# Patient Record
Sex: Female | Born: 1995
Health system: Southern US, Community
[De-identification: ages and names within clinical notes are randomized; demographics above are authoritative.]

## PROBLEM LIST (undated history)

## (undated) DIAGNOSIS — N83209 Unspecified ovarian cyst, unspecified side: Secondary | ICD-10-CM

## (undated) DIAGNOSIS — J45909 Unspecified asthma, uncomplicated: Secondary | ICD-10-CM

## (undated) HISTORY — DX: Unspecified ovarian cyst, unspecified side: N83.209

## (undated) HISTORY — PX: NO PAST SURGERIES: SHX2092

---
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
## 2014-07-21 NOTE — Discharge Summary (Signed)
PATIENT NAME:  Tracy Howard, Tracy Howard MR#:  [id number] DATE OF BIRTH:  05/17/1995  DATE OF ADMISSION:  09/17/2011 DATE OF DISCHARGE:  09/20/2011  HOSPITAL COURSE: See the dictated history and physical for details of admission. This 19 year old woman was brought into the Emergency Room after being found passed out in public. It transpired that she had not been sleeping for days due to anxiety. She had taken some Xanax from a friend and become dehydrated and tried to walk across town. She was admitted to the hospital for observation and support and orientation to psychiatric treatment. In the hospital, the patient was pleasant and cooperative. After being somewhat down and sad on first admission her affect improved significantly. She denied any suicidal ideation. She did not engage in any dangerous behavior. She attended groups appropriately. She gave a history consistent with chronic anxiety and depression. I started her on citalopram 20 mg a day, which he has tolerated fine without any significant problems. No new medical problems have arisen. At the time of discharge, the patient is showing improved insight and tolerating medication well. Supportive therapy was done as well as education about taking care of herself and getting treatment for chronic depression. The patient was agreeable to a follow-up treatment plan.   DISCHARGE MEDICATION: Citalopram 20 mg p.o. daily.   LABORATORY RESULTS: Drug screen was positive for benzodiazepines. Urinalysis positive for white blood cells and blood and she was currently having her period. Thyroid stimulating hormone normal. Chemistry panel unremarkable. CBC showed slightly low hemoglobin at 11.7.   MENTAL STATUS EXAMINATION: Neatly dressed and groomed young woman who looks her stated age. Alert and oriented x4. Short-term and long-term memory intact. Interaction normal. Speech normal rate, tone, and volume. Psychomotor activity normal. Affect euthymic, upbeat, and appropriately  reactive. Mood stated as good. Thoughts are lucid with no evidence of thought disorder or loosening of associations. Denies auditory or visual hallucinations. Denies suicidal or homicidal ideation. Shows improved judgment and insight.   DISPOSITION: Discharged back to continue living with her boyfriend. Arrangements will be made for follow-up treatment at [facility].     DIAGNOSIS PRINCIPLE AND PRIMARY:   AXIS I: Depression, not otherwise specified.   SECONDARY DIAGNOSES:   AXIS I: Deferred.   AXIS II: Borderline traits.   AXIS III: No diagnosis.   AXIS IV: Moderate from difficult relationship with her boyfriend.   AXIS V: Functioning at time of discharge 58. ____________________________ Audery AmelJohn T. Klever Twyford, MD jtc:slb D: 09/20/2011 16:06:39 ET T: 09/21/2011 11:40:33 ET JOB#: [id number]  cc: Audery AmelJohn T. Maribel Hadley, MD, <Dictator> Audery AmelJOHN T Aydia Maj MD ELECTRONICALLY SIGNED 09/22/2011 9:31

## 2014-07-21 NOTE — H&P (Signed)
PATIENT NAME:  Tracy Howard, Tracy Howard MR#:  [id number] DATE OF BIRTH:  01/18/1996  DATE OF ADMISSION:  09/17/2011  IDENTIFYING INFORMATION AND CHIEF COMPLAINT: 19 year old woman brought in by EMS after being found passed out in the street.   CHIEF COMPLAINT: "I got in a fight with my boyfriend".   HISTORY OF PRESENT ILLNESS: The patient was found passed out in public and EMS were called. The patient states that she had been arguing and fighting with her boyfriend for the last several days. He left her at the house and took the car so that she could not leave. She felt like she could not stay there anymore and decided to walk to her mother's house. Evidently the mother's house is about a three-hour walk or so from where she started. Additionally, the patient said that she had not had anything to drink all day and had not had anything to eat for the last couple of days. Additionally, she admits that she took at least six Xanax tablets which she took from a friend of hers. She began to get dizzy and evidently passed out on her walk. The patient states that her mood recently has been up and down, mostly frustrated with her boyfriend with whom she has been fighting. She recently discovered that he had cheated on her with a friend of hers and they have been fighting nonstop ever since. She says she has not slept in several days and does not ever feel like eating. She has still been able to go to work, however. She denies any suicidal ideation and denies that she is trying to kill herself. She denies any psychotic symptoms. She indicates that her usual sleep patterns are pretty chaotic. She denies that she drinks alcohol. Denies that she uses any other drugs. Currently not in any kind of psychiatric treatment, not taking any psychiatric medicine or receiving any counseling.   PAST PSYCHIATRIC HISTORY: The patient has received counseling when she was an adolescent after her parents split up but never gotten any psychiatric  treatment since then. Never been in a psychiatric hospital. Denies that she has ever tried to kill herself in the past. Denies ever being on any psychiatric medicine. Denies any history of violence to others.   SUBSTANCE ABUSE HISTORY: She denies that she drinks alcohol currently or regularly. Says that she does not abuse any drugs. She indicates that it was an unusual thing that she took this overdose of Xanax.   FAMILY HISTORY: No known family history of mental illness.   PAST MEDICAL HISTORY: Has mild asthma but it doesn't seem to cause her any trouble and she does not take anything for it. No other known medical problems.   SOCIAL HISTORY: The patient lives with her boyfriend. They have been together for about a year and a half. Recently he cheated on her with her closest friend with the predictable anger and discord in the house except that the patient has continued to stay there and fight with him rather than leave. 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 Not in school.   REVIEW OF SYSTEMS: States that she has recently been tired. Been feeling moody and somewhat depressed. Denies any suicidal ideation. Denies psychotic symptoms. Admits that she occasionally cuts herself as a way to release tension but denies that it was ever a wish to harm herself.   MENTAL STATUS EXAMINATION: Reasonably well groomed, healthy appearing young woman interviewed in the Emergency Room. Alert  and oriented x4. Fully awake. Cooperative with the interview. Eye contact normal. Psychomotor activity normal. Speech normal rate, tone, and volume. Affect is anxious but reactive. Mood stated as being okay. Thoughts are lucid with no obvious loosening of associations or delusions. Denies any psychotic symptoms. No evidence of paranoia. Denies suicidal or homicidal ideation. Judgment and insight recently somewhat impaired. Cognitively generally appears to have good short-term and long-term memory.    PHYSICAL EXAMINATION:   GENERAL: The patient is a healthy looking young woman in no acute distress. She has acne on her face, particularly on her forehead but no other skin lesions that are acute. She has marks from old cutting on her left forearm.   HEENT: Face is symmetric. Oral mucosa normal. Pupils equal and reactive.   NECK: Supple and nontender.   BACK: Nontender.   MUSCULOSKELETAL: Full range of motion at all extremities. Normal gait. Strength and reflexes normal and symmetric throughout.   NEUROLOGICAL: Cranial nerves symmetric and normal.   LUNGS: Clear to auscultation with no wheezes.   HEART: Regular rate and rhythm. Normal sounds.   ABDOMEN: Soft, nontender. Normal bowel sounds.   VITAL SIGNS: Temperature 97.8, pulse 67, respirations 20, blood pressure 113/59.   LABORATORY, DIAGNOSTIC, AND RADIOLOGICAL DATA: Most notable for positive benzodiazepines on the drug screen. Urinalysis shows white and red blood cells, no bacteria. She says that she is currently on her menstrual period which probably explains it. Thyroid stimulating hormone normal. Chemistry panel and CBC unremarkable.   ASSESSMENT: This is an 19 year old woman who passed out walking from her boyfriend's to her mother's house. She is denying suicidal ideation. Does not have a full syndrome of depression but does sound like she has some chronic mood instability. I am concerned, however, that that she did take an excessive number of Xanax pills impulsively without knowing what they would do indicating recent poor judgment. I think that it would be the right thing to do to at least briefly admit her for stabilization and consider referral for outpatient treatment.   TREATMENT PLAN:  1. Admit to the psychiatry ward.  2. I've suggested to her that we try starting citalopram for her anxiety, mood, and irritability and she agrees to this. 20 mg a day of citalopram will be started. Otherwise, no new medications.   3. Engage patient in groups and activities on the unit. 4. Involve her with planning for discharge and follow-up after she leaves.   DIAGNOSES PRINCIPLE AND PRIMARY:  AXIS I: Mood disorder, not otherwise specified.   SECONDARY DIAGNOSES:  AXIS I: Deferred.   AXIS II: Deferred.   AXIS III:  1. Overdose of Xanax. 2. Mild asthma.   AXIS IV: Moderate to severe from acute disruption in her relationship with her boyfriend, questionable place to live.   AXIS V: Functioning at time of admission is 43.   ____________________________ Audery AmelJohn T. Leanord Thibeau, MD jtc:drc D: 09/17/2011 14:37:53 ET T: 09/17/2011 15:10:13 ET JOB#: [id number]  cc: Audery AmelJohn T. Graison Leinberger, MD, <Dictator> Audery AmelJOHN T Virginia Francisco MD ELECTRONICALLY SIGNED 09/17/2011 15:26

## 2015-12-25 ENCOUNTER — Ambulatory Visit (INDEPENDENT_AMBULATORY_CARE_PROVIDER_SITE_OTHER): Payer: Managed Care, Other (non HMO) | Admitting: Obstetrics and Gynecology

## 2015-12-25 ENCOUNTER — Encounter: Payer: Self-pay | Admitting: Obstetrics and Gynecology

## 2015-12-25 VITALS — BP 131/76 | HR 87 | Ht 63.0 in | Wt 128.0 lb

## 2015-12-25 DIAGNOSIS — N979 Female infertility, unspecified: Secondary | ICD-10-CM

## 2015-12-25 DIAGNOSIS — Z720 Tobacco use: Secondary | ICD-10-CM | POA: Diagnosis not present

## 2015-12-25 DIAGNOSIS — N946 Dysmenorrhea, unspecified: Secondary | ICD-10-CM | POA: Diagnosis not present

## 2015-12-25 DIAGNOSIS — J45909 Unspecified asthma, uncomplicated: Secondary | ICD-10-CM | POA: Insufficient documentation

## 2015-12-25 NOTE — Progress Notes (Signed)
GYN ENCOUNTER NOTE  Subjective:       Tracy Howard is a 20 y.o. [id number] female is here for gynecologic evaluation of the following issues:  1. Secondary infertility.    Currently attempting to conceive over the past 18 months. History therapeutic abortion 2012. No history of galactorrhea. No history of hirsutism. No history of thyroid disease.   Gynecologic History Patient's last menstrual period was 12/22/2015 (exact date). Contraception: none  Menarche: Age 20 Interval: 32-35 days Duration: 68 days Dysmenorrhea: Moderate pain 7/10 over the first 3-4 days requiring 800 mg ibuprofen twice a day; has missed school and work in the past because of severe dysmenorrhea; history of OCP use helped dysmenorrhea; patient reports central cramps and low back pain without radiation. Dyspareunia: Negative Abnormal Pap smear history: Negative STI history: Negative Ovarian cancer a paternal grandmother   Obstetric History OB History  Gravida Para Term Preterm AB Living  1       1    SAB TAB Ectopic Multiple Live Births    1          # Outcome Date GA Lbr Len/2nd Weight Sex Delivery Anes PTL Lv  1 TAB 2012              Past Medical History:  Diagnosis Date  . Ovarian cyst     Past Surgical History:  Procedure Laterality Date  . NO PAST SURGERIES      No current outpatient prescriptions on file prior to visit.   No current facility-administered medications on file prior to visit.     Allergies  Allergen Reactions  . Novocain [Procaine]   . Latex Rash    Social History   Social History  . Marital status: Single    Spouse name: N/A  . Number of children: N/A  . Years of education: N/A   Occupational History  . Not on file.   Social History Main Topics  . Smoking status: Current Every Day Smoker    Packs/day: 0.25    Types: Cigarettes  . Smokeless tobacco: Never Used  . Alcohol use Yes     Comment: SOCIAL  . Drug use: No  . Sexual activity: Yes    Birth  control/ protection: None   Other Topics Concern  . Not on file   Social History Narrative  . No narrative on file    Family History  Problem Relation Age of Onset  . Diabetes Father   . Ovarian cancer Paternal Grandmother   . Breast cancer Neg Hx   . Colon cancer Neg Hx   . Heart disease Neg Hx     The following portions of the patient's history were reviewed and updated as appropriate: allergies, current medications, past family history, past medical history, past social history, past surgical history and problem list.  Review of Systems Review of Systems -Per history of present illness Objective:   BP 131/76   Pulse 87   Ht 5\' 3"  (1.6 m)   Wt 128 lb (58.1 kg)   LMP 12/22/2015 (Exact Date)   BMI 22.67 kg/m  CONSTITUTIONAL: Well-developed, well-nourished female in no acute distress.  Physical exam-deferred  Assessment:   1. Secondary infertility 2. History of therapeutic abortion 3. Tobacco user, lite 4. History of dysmenorrhea   Plan:   1. Semen analysis 2. Rubella titer, Varicella titer, prolactin level, RPR, HIV, Day 22  Progesterone 3. Ultrasound 4. Return in 4 weeks for annual exam  A total of 45 minutes  were spent face-to-face with the patient during the encounter with greater than 50% dealing with counseling and coordination of care.  Herold Harms, MD  Note: This dictation was prepared with Dragon dictation along with smaller phrase technology. Any transcriptional errors that result from this process are unintentional.

## 2015-12-25 NOTE — Patient Instructions (Signed)
1. Ultrasound is scheduled 2. Screening labs for infertility-Drawn today 3. Day 22 serum progesterone level needs to be drawn to confirm ovulation 4.Fiance needs to have semen analysis performed 5. Return in 1 month for annual exam and review lab work and further management planning for infertility 6. Begin taking prenatal vitamins daily to help reduce spina bifida risk in offspring   Infertility Infertility is when you are unable to get pregnant (conceive) after a year of having sex regularly without using birth control. Infertility can also mean that a woman is not able to carry a pregnancy to full term.  Both women and men can have fertility problems. WHAT CAUSES INFERTILITY? What Causes Infertility in Women? There are many possible causes of infertility in women. For some women, the cause of infertility is not known (unexplained infertility). Infertility can also be linked to more than one cause. Infertility problems in women can be caused by problems with the menstrual cycle or reproductive organs, certain medical conditions, and factors related to lifestyle and age.  Problems with your menstrual cycle can interfere with your ovaries producing eggs (ovulation). This can make it difficult to get pregnant. This includes having a menstrual cycle that is very long, very short, or irregular.  Problems with reproductive organs can include:  An abnormally narrow cervix or a cervix that does not remain closed during a pregnancy.  A blockage in your fallopian tubes.  An abnormally shaped uterus.  Uterine fibroids. This is a tissue mass (tumor) that can develop on your uterus.  Medical conditions that can affect a woman's fertility include:  Polycystic ovarian syndrome (PCOS). This is a hormonal disorder that can cause small cysts to grow on your ovaries. This is the most common cause of infertility in women.  Endometriosis. This is a condition in which the tissue that lines your uterus  (endometrium) grows outside of its normal location.  Primary ovary insufficiency. This is when your ovaries stop producing eggs and hormones before the age of 22.  Sexually transmitted diseases, such as chlamydia or gonorrhea. These infections can cause scarring in your fallopian tubes. This makes it difficult for eggs to reach your uterus.  Autoimmune disorders. These are disorders in which your immune system attacks normal, healthy cells.  Hormone imbalances.  Other factors include:  Age. A woman's fertility declines with age, especially after her mid-30s.  Being under- or overweight.  Drinking too much alcohol.  Using drugs.  Exercising excessively.  Being exposed to environmental toxins, such as radiation, pesticides, and certain chemicals. What Causes Infertility in Men? There are many causes of infertility in men. Infertility can be linked to more than one cause. Infertility problems in men can be caused by problems with sperm or the reproductive organs, certain medical conditions, and factors related to lifestyle and age. Some men have unexplained infertility.   Problems with sperm. Infertility can result if there is a problem producing:  Enough sperm (low sperm count).  Enough normally-shaped sperm (sperm morphology).  Sperm that are able to reach the egg (poor motility).  Infertility can also be caused by:  A problem with hormones.  Enlarged veins (varicoceles), cysts (spermatoceles), or tumors of the testicles.  Sexual dysfunction.  Injury to the testicles.  A birth defect, such as not having the tubes that carry sperm (vas deferens).  Medical conditions that can affect a man's fertility include:  Diabetes.  Cancer treatments, such as chemotherapy or radiation.  Klinefelter syndrome. This is an inherited genetic disorder.  Thyroid problems, such as an under- or overactive thyroid.  Cystic fibrosis.  Sexually transmitted diseases.  Other factors  include:  Age. A man's fertility declines with age.  Drinking too much alcohol.  Using drugs.  Being exposed to environmental toxins, such as pesticides and lead. WHAT ARE THE SYMPTOMS OF INFERTILITY? Being unable to get pregnant after one year of having regular sex without using birth control is the only sign of infertility.  HOW IS INFERTILITY DIAGNOSED? In order to be diagnosed with infertility, both partners will have a physical exam. Both partners will also have an extensive medical and sexual history taken. If there is no obvious reason for infertility, additional tests may be done. What Tests Will Women Have? Women may first have tests to check whether they are ovulating each month. The tests may include:  Blood tests to check hormone levels.  An ultrasound of the ovaries. This looks for possible problems on or in the ovaries.  Taking a small sample of the tissue that lines the uterus for examination under a microscope (endometrial biopsy). Women who are ovulating may have additional tests. These may include:  Hysterosalpingography.  This is an X-ray of the fallopian tubes and uterus taken after a specific type of dye is injected.  This test can show the shape of the uterus and whether the fallopian tubes are open.  Laparoscopy.  In this test, a lighted tube (laparoscope) is used to look for problems in the fallopian tubes and other female organs.  Transvaginal ultrasound.  This is an imaging test to check for abnormalities of the uterus and ovaries.  A health care provider can use this test to count the number of follicles on the ovaries.  Hysteroscopy.  This test involves using a lighted tube to examine the cervix and inside the uterus.  It is done to find any abnormalities inside the uterus. What Tests Will Men Have? Tests for men's infertility includes:  Semen tests to check sperm count, morphology, and motility.  Blood tests to check for hormone  levels.  Taking a small sample of tissue from inside a testicle (biopsy). This is examined under a microscope.  Blood tests to check for genetic abnormalities (genetic testing). HOW ARE WOMEN TREATED FOR INFERTILITY?  Treatment depends on the cause of infertility. Most cases of infertility in women are treated with medicine or surgery.  Women may take medicine to:  Correct ovulation problems.  Treat other health conditions, such as PCOS.  Surgery may be done to:  Repair damage to the ovaries, fallopian tubes, cervix, or uterus.  Remove growths from the uterus.  Remove scar tissue from the uterus, pelvis, or other female organs. HOW ARE MEN TREATED FOR INFERTILITY?  Treatment depends on the cause of infertility. Most cases of infertility in men are treated with medicine or surgery.   Men may take medicine to:  Correct hormone problems.  Treat other health conditions.  Treat sexual dysfunction.  Surgery may be done to:  Remove blockages in the reproductive tract.  Correct other structural problems of the reproductive tract. WHAT IS ASSISTED REPRODUCTIVE TECHNOLOGY? Assisted reproductive technology (ART) refers to all treatments and procedures that combine eggs and sperm outside the body to try to help a couple conceive. ART is often combined with fertility drugs to stimulate ovulation. Sometimes ART is done using eggs retrieved from another woman's body (donor eggs) or from previously frozen fertilized eggs (embryos).  There are different types of ART. These include:   Intrauterine insemination (  IUI).  In this procedure, sperm is placed directly into a woman's uterus with a long, thin tube.  This may be most effective for infertility caused by sperm problems, including low sperm count and low motility.  Can be used in combination with fertility drugs.  In vitro fertilization (IVF).  This is often done when a woman's fallopian tubes are blocked or when a man has low  sperm counts.  Fertility drugs stimulate the ovaries to produce multiple eggs. Once mature, these eggs are removed from the body and combined with the sperm to be fertilized.  These fertilized eggs are then placed in the woman's uterus.   This information is not intended to replace advice given to you by your health care provider. Make sure you discuss any questions you have with your health care provider.   Document Released: 03/18/2003 Document Revised: 12/04/2014 Document Reviewed: 11/28/2013 Elsevier Interactive Patient Education [employer]

## 2015-12-26 ENCOUNTER — Encounter: Payer: Self-pay | Admitting: Obstetrics and Gynecology

## 2015-12-26 ENCOUNTER — Ambulatory Visit (INDEPENDENT_AMBULATORY_CARE_PROVIDER_SITE_OTHER): Payer: Managed Care, Other (non HMO)

## 2015-12-26 ENCOUNTER — Telehealth: Payer: Self-pay | Admitting: Obstetrics and Gynecology

## 2015-12-26 DIAGNOSIS — N979 Female infertility, unspecified: Secondary | ICD-10-CM

## 2015-12-26 NOTE — Telephone Encounter (Signed)
PT CAME IN FOR AN US TODAY AND SHE GOT A MESSAGE ON HER MY CHART STATING THAT SHE IS NOT IMMUNE AND NEEDS A VARICELLA VACCINE, SO SHE WASN'T SURE WHAT SHE NEEDED TO DO ABOUT THAT.

## 2015-12-26 NOTE — Telephone Encounter (Signed)
Pt aware she may get vaccine from [facility]. Pt aware varicella is for chicken pox.

## 2015-12-27 LAB — TSH: TSH: 1.2 u[IU]/mL (ref 0.450–4.500)

## 2015-12-27 LAB — VARICELLA ZOSTER ANTIBODY, IGG

## 2015-12-27 LAB — PROLACTIN: Prolactin: 15.4 ng/mL (ref 4.8–23.3)

## 2015-12-27 LAB — HIV ANTIBODY (ROUTINE TESTING W REFLEX): HIV SCREEN 4TH GENERATION: NONREACTIVE

## 2015-12-27 LAB — RPR: RPR: NONREACTIVE

## 2015-12-27 LAB — RUBELLA SCREEN: Rubella Antibodies, IGG: 5.45 index (ref >0.99)

## 2016-01-13 ENCOUNTER — Other Ambulatory Visit: Payer: Managed Care, Other (non HMO)

## 2016-01-13 DIAGNOSIS — N979 Female infertility, unspecified: Secondary | ICD-10-CM

## 2016-01-14 LAB — PROGESTERONE: Progesterone: 11.7 ng/mL

## 2016-01-19 DIAGNOSIS — Z01419 Encounter for gynecological examination (general) (routine) without abnormal findings: Secondary | ICD-10-CM | POA: Insufficient documentation

## 2016-01-19 NOTE — Progress Notes (Deleted)
ANNUAL PREVENTATIVE CARE GYN  ENCOUNTER NOTE  Subjective:       Tracy Howard is a 20 y.o. [id number] female here for a routine annual gynecologic exam.  Current complaints: 1.      Gynecologic History Patient's last menstrual period was 12/22/2015 (exact date). Contraception: none Last Pap: ?Marland Kitchen. Results were: normal Last mammogram: n.a. Results were: Marland Kitchen.  Obstetric History OB History  Gravida Para Term Preterm AB Living  1       1    SAB TAB Ectopic Multiple Live Births    1          # Outcome Date GA Lbr Len/2nd Weight Sex Delivery Anes PTL Lv  1 TAB 2012              Past Medical History:  Diagnosis Date  . Ovarian cyst     Past Surgical History:  Procedure Laterality Date  . NO PAST SURGERIES      No current outpatient prescriptions on file prior to visit.   No current facility-administered medications on file prior to visit.     Allergies  Allergen Reactions  . Novocain [Procaine]   . Latex Rash    Social History   Social History  . Marital status: Single    Spouse name: N/A  . Number of children: N/A  . Years of education: N/A   Occupational History  . Not on file.   Social History Main Topics  . Smoking status: Current Every Day Smoker    Packs/day: 0.25    Types: Cigarettes  . Smokeless tobacco: Never Used  . Alcohol use Yes     Comment: SOCIAL  . Drug use: No  . Sexual activity: Yes    Birth control/ protection: None   Other Topics Concern  . Not on file   Social History Narrative  . No narrative on file    Family History  Problem Relation Age of Onset  . Diabetes Father   . Ovarian cancer Paternal Grandmother   . Breast cancer Neg Hx   . Colon cancer Neg Hx   . Heart disease Neg Hx     The following portions of the patient's history were reviewed and updated as appropriate: allergies, current medications, past family history, past medical history, past social history, past surgical history and problem list.  Review of  Systems ROS Review of Systems - General ROS: negative for - chills, fatigue, fever, hot flashes, night sweats, weight gain or weight loss Psychological ROS: negative for - anxiety, decreased libido, depression, mood swings, physical abuse or sexual abuse Ophthalmic ROS: negative for - blurry vision, eye pain or loss of vision ENT ROS: negative for - headaches, hearing change, visual changes or vocal changes Allergy and Immunology ROS: negative for - hives, itchy/watery eyes or seasonal allergies Hematological and Lymphatic ROS: negative for - bleeding problems, bruising, swollen lymph nodes or weight loss Endocrine ROS: negative for - galactorrhea, hair pattern changes, hot flashes, malaise/lethargy, mood swings, palpitations, polydipsia/polyuria, skin changes, temperature intolerance or unexpected weight changes Breast ROS: negative for - new or changing breast lumps or nipple discharge Respiratory ROS: negative for - cough or shortness of breath Cardiovascular ROS: negative for - chest pain, irregular heartbeat, palpitations or shortness of breath Gastrointestinal ROS: no abdominal pain, change in bowel habits, or black or bloody stools Genito-Urinary ROS: no dysuria, trouble voiding, or hematuria Musculoskeletal ROS: negative for - joint pain or joint stiffness Neurological ROS: negative for - bowel and  bladder control changes Dermatological ROS: negative for rash and skin lesion changes   Objective:   LMP 12/22/2015 (Exact Date)  CONSTITUTIONAL: Well-developed, well-nourished female in no acute distress.  PSYCHIATRIC: Normal mood and affect. Normal behavior. Normal judgment and thought content. NEUROLGIC: Alert and oriented to person, place, and time. Normal muscle tone coordination. No cranial nerve deficit noted. HENT:  Normocephalic, atraumatic, External right and left ear normal. Oropharynx is clear and moist EYES: Conjunctivae and EOM are normal. Pupils are equal, round, and  reactive to light. No scleral icterus.  NECK: Normal range of motion, supple, no masses.  Normal thyroid.  SKIN: Skin is warm and dry. No rash noted. Not diaphoretic. No erythema. No pallor. CARDIOVASCULAR: Normal heart rate noted, regular rhythm, no murmur. RESPIRATORY: Clear to auscultation bilaterally. Effort and breath sounds normal, no problems with respiration noted. BREASTS: Symmetric in size. No masses, skin changes, nipple drainage, or lymphadenopathy. ABDOMEN: Soft, normal bowel sounds, no distention noted.  No tenderness, rebound or guarding.  BLADDER: Normal PELVIC:  External Genitalia: Normal  BUS: Normal  Vagina: Normal  Cervix: Normal  Uterus: Normal  Adnexa: Normal  RV: {Blank multiple:19196::"External Exam NormaI","No Rectal Masses","Normal Sphincter tone"}  MUSCULOSKELETAL: Normal range of motion. No tenderness.  No cyanosis, clubbing, or edema.  2+ distal pulses. LYMPHATIC: No Axillary, Supraclavicular, or Inguinal Adenopathy.    Assessment:   Annual gynecologic examination 20 y.o. Contraception: none Normal BMI Problem List Items Addressed This Visit    Dysmenorrhea   Well woman exam with routine gynecological exam - Primary    Other Visit Diagnoses    Tobacco user       Infertility, female          Plan:  Pap: Pap, Reflex if ASCUS and GC/CT NAAT Mammogram: Not Indicated Stool Guaiac Testing:  Not Indicated Labs: ., Lipid 1, FBS, TSH, Hemoglobin A1C and Vit D Level"". Routine preventative health maintenance measures emphasized: {Blank multiple:19196::"Exercise/Diet/Weight control","Tobacco Warnings","Alcohol/Substance use risks","Stress Management","Peer Pressure Issues","Safe Sex"} *** Return to Clinic - [address] [place], [place]

## 2016-01-22 ENCOUNTER — Encounter: Payer: Managed Care, Other (non HMO) | Admitting: Obstetrics and Gynecology

## 2018-02-28 ENCOUNTER — Other Ambulatory Visit: Payer: Self-pay

## 2018-02-28 ENCOUNTER — Encounter (HOSPITAL_BASED_OUTPATIENT_CLINIC_OR_DEPARTMENT_OTHER): Payer: Self-pay | Admitting: Emergency Medicine

## 2018-02-28 ENCOUNTER — Emergency Department (HOSPITAL_BASED_OUTPATIENT_CLINIC_OR_DEPARTMENT_OTHER)
Admission: EM | Admit: 2018-02-28 | Discharge: 2018-02-28 | Disposition: A | Discharge status: Home / Self Care | Payer: Self-pay | Attending: Emergency Medicine | Admitting: Emergency Medicine

## 2018-02-28 DIAGNOSIS — J4521 Mild intermittent asthma with (acute) exacerbation: Secondary | ICD-10-CM | POA: Insufficient documentation

## 2018-02-28 DIAGNOSIS — F17210 Nicotine dependence, cigarettes, uncomplicated: Secondary | ICD-10-CM | POA: Insufficient documentation

## 2018-02-28 HISTORY — DX: Unspecified asthma, uncomplicated: J45.909

## 2018-02-28 MED ORDER — AMOXICILLIN 500 MG PO CAPS: 500.0000 mg | ORAL_CAPSULE | Freq: Three times a day (TID) | ORAL | 0 refills | Status: DC

## 2018-02-28 MED ORDER — ALBUTEROL SULFATE HFA 108 (90 BASE) MCG/ACT IN AERS
2.0000 | INHALATION_SPRAY | Freq: Four times a day (QID) | RESPIRATORY_TRACT | Status: DC
  Administered 2018-02-28: 2 via RESPIRATORY_TRACT
  Filled 2018-02-28: qty 6.7

## 2018-02-28 MED FILL — AMOXICILLIN 500 MG CAPSULE: 500 | 7 days supply | Qty: 21 | Fill #0

## 2018-02-28 NOTE — Discharge Instructions (Addendum)
Use the albuterol inhaler as needed.  Stop taking the Augmentin.  Give a trial back on the amoxicillin.  If you have a reaction to that we will need to stop that and be switched to clindamycin.  Return for any new or worse symptoms.

## 2018-02-28 NOTE — ED Provider Notes (Signed)
[facility] EMERGENCY DEPARTMENT Provider Note   CSN: [id number] Arrival date & time: 02/28/18  1158     History   Chief Complaint Chief Complaint  Patient presents with  . chest tightness    HPI Tracy Howard is a 22 y.o. female.  Patient presents with chest tightness and difficulty breathing after taking doses of Augmentin last evening.  Patient taking Augmentin for a tooth infection.  Patient presumed it was a reaction to the antibiotics so she stopped taking them.  But this morning she started with another episode of chest tightness and difficulty breathing.  Oxygen saturations on presentation were 96%.     Past Medical History:  Diagnosis Date  . Asthma   . Ovarian cyst     Patient Active Problem List   Diagnosis Date Noted  . Well woman exam with routine gynecological exam 01/19/2016  . Secondary female infertility 12/25/2015  . Dysmenorrhea 12/25/2015  . Childhood asthma 12/25/2015    Past Surgical History:  Procedure Laterality Date  . NO PAST SURGERIES       OB History    Gravida  1   Para      Term      Preterm      AB  1   Living        SAB      TAB  1   Ectopic      Multiple      Live Births               Home Medications    Prior to Admission medications   Medication Sig Start Date End Date Taking? Authorizing Provider  amoxicillin-clavulanate (AUGMENTIN) 875-125 MG tablet Take 1 tablet by mouth 2 (two) times daily. 02/25/18  Yes [provider]  amoxicillin (AMOXIL) 500 MG capsule Take 1 capsule (500 mg total) by mouth 3 (three) times daily. 02/28/18   Vanetta MuldersZackowski, Ronelle Michie, MD    Family History Family History  Problem Relation Age of Onset  . Diabetes Father   . Ovarian cancer Paternal Grandmother   . Breast cancer Neg Hx   . Colon cancer Neg Hx   . Heart disease Neg Hx     Social History Social History   Tobacco Use  . Smoking status: Current Every Day Smoker    Packs/day: 0.50    Types:  Cigarettes  . Smokeless tobacco: Never Used  Substance Use Topics  . Alcohol use: Yes    Comment: SOCIAL  . Drug use: No     Allergies   Novocain [procaine] and Latex   Review of Systems Review of Systems  Constitutional: Negative for fever.  HENT: Positive for dental problem.   Eyes: Negative for visual disturbance.  Respiratory: Positive for shortness of breath.   Cardiovascular: Positive for chest pain.  Gastrointestinal: Negative for abdominal pain, nausea and vomiting.  Genitourinary: Negative for dysuria.  Musculoskeletal: Negative for back pain.  Skin: Negative for rash.  Neurological: Negative for syncope and headaches.  Hematological: Does not bruise/bleed easily.  Psychiatric/Behavioral: Negative for confusion.     Physical Exam Updated Vital Signs BP 129/80 (BP Location: Right Arm)   Pulse 78   Temp 98.5 F (36.9 C) (Oral)   Resp 20   Ht 1.6 m (5\' 3" )   Wt 54.4 kg   LMP 02/26/2018   SpO2 100%   BMI 21.26 kg/m   Physical Exam Vitals signs and nursing note reviewed.  Constitutional:  General: She is not in acute distress.    Appearance: She is not toxic-appearing.  HENT:     Head: Normocephalic and atraumatic.     Mouth/Throat:     Mouth: Mucous membranes are moist.     Pharynx: No oropharyngeal exudate or posterior oropharyngeal erythema.  Eyes:     Extraocular Movements: Extraocular movements intact.     Conjunctiva/sclera: Conjunctivae normal.     Pupils: Pupils are equal, round, and reactive to light.  Neck:     Musculoskeletal: Normal range of motion and neck supple.  Cardiovascular:     Rate and Rhythm: Normal rate and regular rhythm.  Pulmonary:     Effort: Pulmonary effort is normal. No respiratory distress.     Breath sounds: No wheezing.  Abdominal:     General: Bowel sounds are normal.     Palpations: Abdomen is soft.  Musculoskeletal: Normal range of motion.        General: No swelling.  Lymphadenopathy:     Cervical: No  cervical adenopathy.  Skin:    General: Skin is warm.     Capillary Refill: Capillary refill takes less than 2 seconds.  Neurological:     General: No focal deficit present.     Mental Status: She is alert and oriented to person, place, and time.      ED Treatments / Results  Labs (all labs ordered are listed, but only abnormal results are displayed) Labs Reviewed - No data to display  EKG None  Radiology No results found.   Results for orders placed or performed in visit on 01/13/16  Progesterone  Result Value Ref Range   Progesterone 11.7 ng/mL   No results found.   Procedures Procedures (including critical care time)  Medications Ordered in ED Medications  albuterol (PROVENTIL HFA;VENTOLIN HFA) 108 (90 Base) MCG/ACT inhaler 2 puff (2 puffs Inhalation Given 02/28/18 1433)     Initial Impression / Assessment and Plan / ED Course  I have reviewed the triage vital signs and the nursing notes.  Pertinent labs & imaging results that were available during my care of the patient were reviewed by me and considered in my medical decision making (see chart for details).     Not clear whether symptoms related to allergic reaction or not.  Patient has taken amoxicillin many times in the past without any difficulty.  Patient given albuterol inhaler here to go.  Patient will be continued on just amoxicillin for tooth infection.  Patient may have reacted to clavulanic acid aspect of Augmentin.  In summary may have been an allergic reaction do not think it was to penicillin patient's had that many times in the past.  Patient will follow-up with her dentist.  Patient will use albuterol inhaler.  She will return for any new or worse symptoms.  Patient nontoxic no acute distress while in the emergency department observed for a while did fine.  Final Clinical Impressions(s) / ED Diagnoses   Final diagnoses:  Mild intermittent reactive airway disease with acute exacerbation    ED  Discharge Orders         Ordered    amoxicillin (AMOXIL) 500 MG capsule  3 times daily     02/28/18 1511           Vanetta Mulders, MD 03/10/18 [phone]

## 2018-02-28 NOTE — ED Notes (Signed)
Pt verbalizes understanding of d/c instructions and denies any further needs at this time. 

## 2018-02-28 NOTE — ED Triage Notes (Signed)
Chest tightness and difficulty breathing after taking dose of Augmentin last evening.  (Taking augmenting for tooth infection). Pt assumed it was a reaction to the abx so she didn't take any more. But this morning she started another episode of the chest tightness and difficulty breathing.

## 2023-08-31 ENCOUNTER — Ambulatory Visit
Admission: EM | Admit: 2023-08-31 | Discharge: 2023-08-31 | Disposition: A | Discharge status: Home / Self Care | Payer: Self-pay | Attending: Emergency Medicine | Admitting: Emergency Medicine

## 2023-08-31 ENCOUNTER — Encounter: Payer: Self-pay | Admitting: Emergency Medicine

## 2023-08-31 DIAGNOSIS — J069 Acute upper respiratory infection, unspecified: Secondary | ICD-10-CM | POA: Insufficient documentation

## 2023-08-31 LAB — GROUP A STREP BY PCR: Group A Strep by PCR: NOT DETECTED

## 2023-08-31 MED ORDER — PROMETHAZINE-DM 6.25-15 MG/5ML PO SYRP: 5.0000 mL | ORAL_SOLUTION | Freq: Four times a day (QID) | ORAL | 0 refills | Status: AC | PRN

## 2023-08-31 MED ORDER — BENZONATATE 100 MG PO CAPS: 200.0000 mg | ORAL_CAPSULE | Freq: Three times a day (TID) | ORAL | 0 refills | Status: AC

## 2023-08-31 MED ORDER — IPRATROPIUM BROMIDE 0.06 % NA SOLN: 2.0000 | Freq: Four times a day (QID) | NASAL | 12 refills | Status: AC

## 2023-08-31 NOTE — Discharge Instructions (Addendum)
 Your strep test today was negative.  Your physical exam does reveal that you have an upper respiratory tract infection and it is most likely a viral.  If your symptoms last longer than 10 days, you begin running fevers, or you start blowing bloody mucus out of your nose please return for reevaluation and we can revisit the need for possible antibiotics at that time.  Use the Atrovent nasal spray, 2 squirts in each nostril every 6 hours, as needed for runny nose and postnasal drip.  Use the Tessalon Perles every 8 hours during the day.  Take them with a small sip of water.  They may give you some numbness to the base of your tongue or a metallic taste in your mouth, this is normal.  Use the Promethazine DM cough syrup at bedtime for cough and congestion.  It will make you drowsy so do not take it during the day.  Return for reevaluation or see your primary care provider for any new or worsening symptoms.

## 2023-08-31 NOTE — ED Provider Notes (Signed)
 [facility]    CSN: [id number] Arrival date & time: 08/31/23  1734      History   Chief Complaint Chief Complaint  Patient presents with   Sore Throat    HPI Tracy Howard is a 28 y.o. female.   HPI  28 year old female with a past medical history significant for asthma and ovarian cyst presents for evaluation of respiratory symptoms that began 5 days ago and consist of a headache, nasal congestion and sinus pressure with clear nasal discharge, sore throat, and a nonproductive cough.  She is also complaining of pain in both of her ears.  She denies any fever, shortness breath, or wheezing.  One of her coworkers recently had URI symptoms as well.  Past Medical History:  Diagnosis Date   Asthma    Ovarian cyst     Patient Active Problem List   Diagnosis Date Noted   Well woman exam with routine gynecological exam 01/19/2016   Secondary female infertility 12/25/2015   Dysmenorrhea 12/25/2015   Childhood asthma 12/25/2015    Past Surgical History:  Procedure Laterality Date   NO PAST SURGERIES      OB History     Gravida  1   Para      Term      Preterm      AB  1   Living         SAB      IAB  1   Ectopic      Multiple      Live Births               Home Medications    Prior to Admission medications   Medication Sig Start Date End Date Taking? Authorizing Provider  benzonatate (TESSALON) 100 MG capsule Take 2 capsules (200 mg total) by mouth every 8 (eight) hours. 08/31/23  Yes Kent Pear, NP  ipratropium (ATROVENT) 0.06 % nasal spray Place 2 sprays into both nostrils 4 (four) times daily. 08/31/23  Yes Kent Pear, NP  promethazine-dextromethorphan (PROMETHAZINE-DM) 6.25-15 MG/5ML syrup Take 5 mLs by mouth 4 (four) times daily as needed. 08/31/23  Yes Kent Pear, NP    Family History Family History  Problem Relation Age of Onset   Diabetes Father    Ovarian cancer Paternal Grandmother    Breast cancer Neg Hx    Colon  cancer Neg Hx    Heart disease Neg Hx     Social History Social History   Tobacco Use   Smoking status: Former    Current packs/day: 0.50    Types: Cigarettes   Smokeless tobacco: Never  Vaping Use   Vaping status: Every Day  Substance Use Topics   Alcohol use: Yes    Comment: SOCIAL   Drug use: No     Allergies   Novocain [procaine] and Latex   Review of Systems Review of Systems  Constitutional:  Negative for fever.  HENT:  Positive for congestion, ear pain, rhinorrhea, sinus pressure and sore throat.   Respiratory:  Positive for cough. Negative for shortness of breath and wheezing.      Physical Exam Triage Vital Signs ED Triage Vitals  Encounter Vitals Group     BP      Systolic BP Percentile      Diastolic BP Percentile      Pulse      Resp      Temp      Temp src  SpO2      Weight      Height      Head Circumference      Peak Flow      Pain Score      Pain Loc      Pain Education      Exclude from Growth Chart    No data found.  Updated Vital Signs BP 124/65 (BP Location: Left Arm)   Pulse 80   Temp 98.9 F (37.2 C) (Oral)   Resp 16   SpO2 98%   Visual Acuity Right Eye Distance:   Left Eye Distance:   Bilateral Distance:    Right Eye Near:   Left Eye Near:    Bilateral Near:     Physical Exam Vitals and nursing note reviewed.  Constitutional:      Appearance: Normal appearance. She is not ill-appearing.  HENT:     Head: Normocephalic and atraumatic.     Right Ear: Tympanic membrane, ear canal and external ear normal. There is no impacted cerumen.     Left Ear: Tympanic membrane, ear canal and external ear normal. There is no impacted cerumen.     Nose: Congestion and rhinorrhea present.     Comments: Nasal mucosa is edematous and erythematous with clear discharge in both nares.    Mouth/Throat:     Mouth: Mucous membranes are moist.     Pharynx: Oropharynx is clear. Posterior oropharyngeal erythema present. No  oropharyngeal exudate.     Comments: Erythema to the soft palate and bilateral tonsillar pillars.  No appreciable edema or exudate. Cardiovascular:     Rate and Rhythm: Normal rate and regular rhythm.     Pulses: Normal pulses.     Heart sounds: Normal heart sounds. No murmur heard.    No friction rub. No gallop.  Pulmonary:     Effort: Pulmonary effort is normal.     Breath sounds: Normal breath sounds. No wheezing, rhonchi or rales.  Musculoskeletal:     Cervical back: Normal range of motion and neck supple. No tenderness.  Lymphadenopathy:     Cervical: No cervical adenopathy.  Skin:    General: Skin is warm and dry.     Capillary Refill: Capillary refill takes less than 2 seconds.     Findings: No rash.  Neurological:     General: No focal deficit present.     Mental Status: She is alert and oriented to person, place, and time.      UC Treatments / Results  Labs (all labs ordered are listed, but only abnormal results are displayed) Labs Reviewed  GROUP A STREP BY PCR    EKG   Radiology No results found.  Procedures Procedures (including critical care time)  Medications Ordered in UC Medications - No data to display  Initial Impression / Assessment and Plan / UC Course  I have reviewed the triage vital signs and the nursing notes.  Pertinent labs & imaging results that were available during my care of the patient were reviewed by me and considered in my medical decision making (see chart for details).   Patient is a pleasant, nontoxic-appearing 28 year old female presenting for evaluation of 5 days with respiratory symptoms as outlined HPI above.  The patient is concerned that she may have contracted an illness from one of her coworkers who was recently treated with 2 rounds of antibiotics for sinus infection.  On exam she does have inflammation of her upper respiratory tract as evidenced by inflamed  nasal mucosa with clear rhinorrhea.  She does not have any  tenderness to compression of frontal or maxillary sinuses bilaterally.  She does have erythema to her posterior oropharynx as well as tumors soft palate and bilateral tonsillar pillars.  No edema or exudate noted.  No cervical lymphadenopathy present.  Cardiopulmonary exam reveals clear lung sounds in all fields.  Differential diagnosis includes strep pharyngitis versus viral URI with cough.  I will order a strep PCR.  Strep PCR is negative.  I will discharge patient home with a diagnosis of viral URI with a cough.  I will prescribe Atrovent nasal spray to help with nasal congestion and sinus pressure.  Tessalon Perles and Promethazine DM cough syrup for cough and congestion.   Final Clinical Impressions(s) / UC Diagnoses   Final diagnoses:  Viral URI with cough     Discharge Instructions      Your strep test today was negative.  Your physical exam does reveal that you have an upper respiratory tract infection and it is most likely a viral.  If your symptoms last longer than 10 days, you begin running fevers, or you start blowing bloody mucus out of your nose please return for reevaluation and we can revisit the need for possible antibiotics at that time.  Use the Atrovent nasal spray, 2 squirts in each nostril every 6 hours, as needed for runny nose and postnasal drip.  Use the Tessalon Perles every 8 hours during the day.  Take them with a small sip of water.  They may give you some numbness to the base of your tongue or a metallic taste in your mouth, this is normal.  Use the Promethazine DM cough syrup at bedtime for cough and congestion.  It will make you drowsy so do not take it during the day.  Return for reevaluation or see your primary care provider for any new or worsening symptoms.    ED Prescriptions     Medication Sig Dispense Auth. Provider   benzonatate (TESSALON) 100 MG capsule Take 2 capsules (200 mg total) by mouth every 8 (eight) hours. 21 capsule Kent Pear, NP    ipratropium (ATROVENT) 0.06 % nasal spray Place 2 sprays into both nostrils 4 (four) times daily. 15 mL Kent Pear, NP   promethazine-dextromethorphan (PROMETHAZINE-DM) 6.25-15 MG/5ML syrup Take 5 mLs by mouth 4 (four) times daily as needed. 118 mL Kent Pear, NP      PDMP not reviewed this encounter.   Kent Pear, NP 08/31/23 [phone]

## 2023-08-31 NOTE — ED Triage Notes (Signed)
 Sx x 5 days  Sore throat Headache  Sinus pressure Nasal congestion
